# Patient Record
Sex: Female | Born: 1935 | Race: White | Hispanic: No | Marital: Married
Health system: Southern US, Community
[De-identification: ages and names within clinical notes are randomized; demographics above are authoritative.]

---

## 2004-09-14 ENCOUNTER — Ambulatory Visit: Payer: Self-pay | Admitting: Pain Medicine

## 2004-09-25 ENCOUNTER — Ambulatory Visit: Payer: Self-pay | Admitting: Pain Medicine

## 2004-11-01 ENCOUNTER — Ambulatory Visit: Payer: Self-pay | Admitting: Pain Medicine

## 2005-01-09 ENCOUNTER — Ambulatory Visit: Payer: Self-pay | Admitting: Pain Medicine

## 2005-01-15 ENCOUNTER — Ambulatory Visit: Payer: Self-pay | Admitting: Pain Medicine

## 2005-02-22 ENCOUNTER — Ambulatory Visit: Payer: Self-pay | Admitting: Pain Medicine

## 2005-02-28 ENCOUNTER — Ambulatory Visit: Payer: Self-pay | Admitting: Pain Medicine

## 2005-04-24 ENCOUNTER — Ambulatory Visit: Payer: Self-pay

## 2006-12-12 ENCOUNTER — Ambulatory Visit: Payer: Self-pay | Admitting: Specialist

## 2007-01-03 ENCOUNTER — Ambulatory Visit: Payer: Self-pay | Admitting: Specialist

## 2007-01-22 ENCOUNTER — Ambulatory Visit: Payer: Self-pay | Admitting: Specialist

## 2007-02-03 ENCOUNTER — Ambulatory Visit: Payer: Self-pay | Admitting: Oncology

## 2007-02-04 ENCOUNTER — Ambulatory Visit: Payer: Self-pay | Admitting: Oncology

## 2007-02-05 ENCOUNTER — Ambulatory Visit: Payer: Self-pay | Admitting: Oncology

## 2007-03-06 ENCOUNTER — Ambulatory Visit: Payer: Self-pay | Admitting: Oncology

## 2007-04-06 ENCOUNTER — Ambulatory Visit: Payer: Self-pay | Admitting: Oncology

## 2007-05-06 ENCOUNTER — Ambulatory Visit: Payer: Self-pay | Admitting: Oncology

## 2007-06-06 ENCOUNTER — Ambulatory Visit: Payer: Self-pay | Admitting: Oncology

## 2007-06-10 ENCOUNTER — Ambulatory Visit: Payer: Self-pay | Admitting: Oncology

## 2007-06-30 ENCOUNTER — Ambulatory Visit: Payer: Self-pay | Admitting: Oncology

## 2007-07-07 ENCOUNTER — Ambulatory Visit: Payer: Self-pay | Admitting: Oncology

## 2007-08-06 ENCOUNTER — Ambulatory Visit: Payer: Self-pay | Admitting: Radiation Oncology

## 2007-09-04 ENCOUNTER — Ambulatory Visit: Payer: Self-pay | Admitting: Oncology

## 2007-09-06 ENCOUNTER — Ambulatory Visit: Payer: Self-pay | Admitting: Radiation Oncology

## 2007-10-06 ENCOUNTER — Ambulatory Visit: Payer: Self-pay | Admitting: Oncology

## 2007-10-23 ENCOUNTER — Ambulatory Visit: Payer: Self-pay | Admitting: Oncology

## 2007-11-06 ENCOUNTER — Ambulatory Visit: Payer: Self-pay | Admitting: Oncology

## 2008-01-04 ENCOUNTER — Ambulatory Visit: Payer: Self-pay | Admitting: Oncology

## 2008-01-21 ENCOUNTER — Ambulatory Visit: Payer: Self-pay | Admitting: Oncology

## 2008-02-04 ENCOUNTER — Ambulatory Visit: Payer: Self-pay | Admitting: Internal Medicine

## 2008-02-12 ENCOUNTER — Other Ambulatory Visit: Payer: Self-pay

## 2008-02-12 ENCOUNTER — Ambulatory Visit: Payer: Self-pay | Admitting: Radiation Oncology

## 2008-02-13 ENCOUNTER — Inpatient Hospital Stay: Payer: Self-pay | Admitting: Internal Medicine

## 2008-03-05 ENCOUNTER — Ambulatory Visit: Payer: Self-pay | Admitting: Internal Medicine

## 2008-06-25 ENCOUNTER — Inpatient Hospital Stay: Payer: Self-pay | Admitting: Internal Medicine

## 2008-06-25 ENCOUNTER — Other Ambulatory Visit: Payer: Self-pay

## 2008-07-09 ENCOUNTER — Ambulatory Visit: Payer: Self-pay

## 2008-07-19 ENCOUNTER — Ambulatory Visit: Payer: Self-pay | Admitting: Specialist

## 2008-07-22 ENCOUNTER — Ambulatory Visit: Payer: Self-pay | Admitting: Specialist

## 2009-02-04 ENCOUNTER — Emergency Department: Payer: Self-pay | Admitting: Emergency Medicine

## 2009-06-29 ENCOUNTER — Inpatient Hospital Stay: Payer: Self-pay | Admitting: Internal Medicine

## 2010-04-05 ENCOUNTER — Ambulatory Visit: Payer: Self-pay | Admitting: Oncology

## 2010-04-30 ENCOUNTER — Inpatient Hospital Stay: Payer: Self-pay | Admitting: Internal Medicine

## 2010-05-05 ENCOUNTER — Ambulatory Visit: Payer: Self-pay | Admitting: Oncology

## 2010-06-05 ENCOUNTER — Ambulatory Visit: Payer: Self-pay | Admitting: Oncology

## 2010-11-30 ENCOUNTER — Ambulatory Visit: Payer: Self-pay | Admitting: Specialist

## 2011-02-04 ENCOUNTER — Emergency Department: Payer: Self-pay | Admitting: Emergency Medicine

## 2012-06-19 ENCOUNTER — Emergency Department: Payer: Self-pay | Admitting: *Deleted

## 2012-11-18 ENCOUNTER — Inpatient Hospital Stay: Payer: Self-pay | Admitting: Specialist

## 2012-11-18 LAB — COMPREHENSIVE METABOLIC PANEL
Albumin: 3.3 g/dL — ABNORMAL LOW (ref 3.4–5.0)
Alkaline Phosphatase: 91 U/L (ref 50–136)
Chloride: 102 mmol/L (ref 98–107)
Co2: 29 mmol/L (ref 21–32)
Creatinine: 1.16 mg/dL (ref 0.60–1.30)
EGFR (African American): 53 — ABNORMAL LOW
EGFR (Non-African Amer.): 45 — ABNORMAL LOW
Osmolality: 284 (ref 275–301)
Sodium: 137 mmol/L (ref 136–145)
Total Protein: 7.2 g/dL (ref 6.4–8.2)

## 2012-11-18 LAB — URINALYSIS, COMPLETE
Bilirubin,UR: NEGATIVE
Hyaline Cast: 4
Ph: 6 (ref 4.5–8.0)
Protein: NEGATIVE
Squamous Epithelial: 2

## 2012-11-18 LAB — CBC WITH DIFFERENTIAL/PLATELET
Eosinophil %: 0.9 %
HCT: 46.1 % (ref 35.0–47.0)
MCHC: 33.7 g/dL (ref 32.0–36.0)
MCV: 90 fL (ref 80–100)
Monocyte #: 0.8 x10 3/mm (ref 0.2–0.9)
Monocyte %: 9.1 %
Neutrophil %: 71.9 %
Platelet: 218 10*3/uL (ref 150–440)
RBC: 5.12 10*6/uL (ref 3.80–5.20)
WBC: 8.7 10*3/uL (ref 3.6–11.0)

## 2012-11-18 LAB — TROPONIN I: Troponin-I: <0.02 ng/mL

## 2012-11-19 LAB — BASIC METABOLIC PANEL
Anion Gap: 4 — ABNORMAL LOW (ref 7–16)
Calcium, Total: 10.6 mg/dL — ABNORMAL HIGH (ref 8.5–10.1)
Chloride: 106 mmol/L (ref 98–107)
Co2: 29 mmol/L (ref 21–32)
Creatinine: 0.96 mg/dL (ref 0.60–1.30)
EGFR (Non-African Amer.): 57 — ABNORMAL LOW
Osmolality: 278 (ref 275–301)
Sodium: 139 mmol/L (ref 136–145)

## 2012-11-22 LAB — PLATELET COUNT: Platelet: 179 10*3/uL (ref 150–440)

## 2012-11-24 LAB — CULTURE, BLOOD (SINGLE)

## 2012-11-24 LAB — PROTIME-INR: INR: 0.9

## 2012-11-25 LAB — CULTURE, BLOOD (SINGLE)

## 2013-02-03 DEATH — deceased

## 2013-05-04 IMAGING — CT CT CHEST W/ CM
1 series · 15 of 33 positions shown, 19 images · IV contrast (agent unspecified)
Comparison: 11/30/2010

REASON FOR EXAM: enlarging lung nodule
COMMENTS:

PROCEDURE:     CT  - CT CHEST WITH CONTRAST  - November 19, 2012  [DATE]
RESULT:     Indication: Enlarging lung nodule
TECHNIQUE: Multiple axial images of the chest are obtained with 75 mL of
Msovue-PGGintravenous contrast.

[Series 2: soft tissue · axial · 0.70mm/px · z∈[-546,-270]mm · 15 of 110 slices shown, 19 images]
[im 9/110  mediastinal]
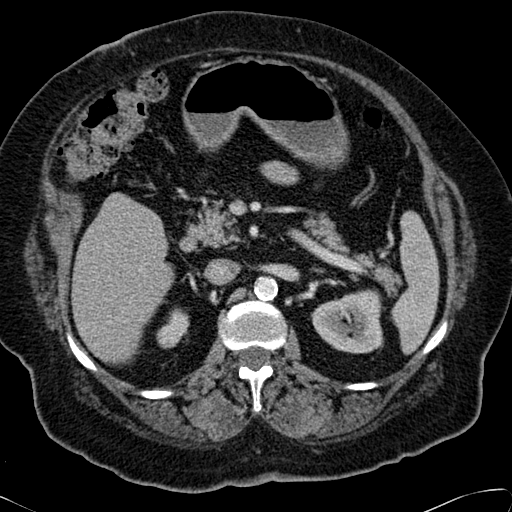
[im 9/110  lung]
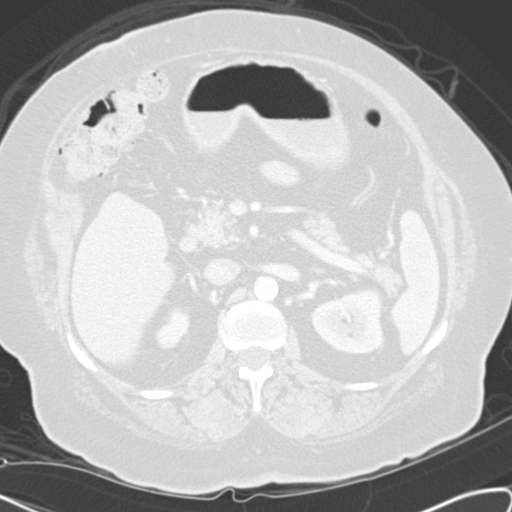
[im 17/110  lung]
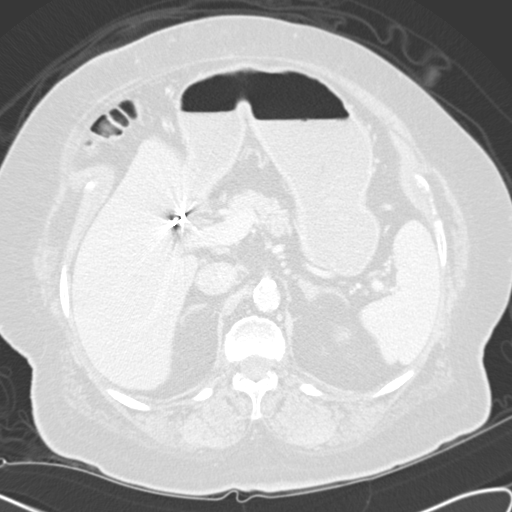
[im 22/110  lung]
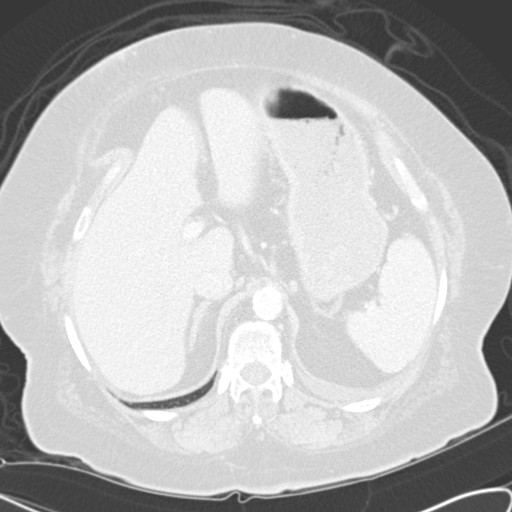
[im 29/110  lung]
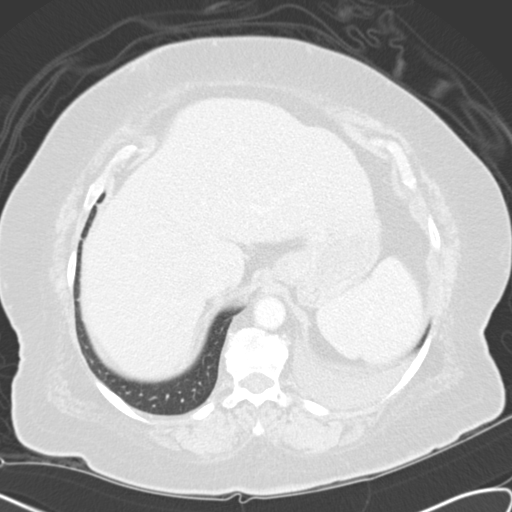
[im 37/110  mediastinal]
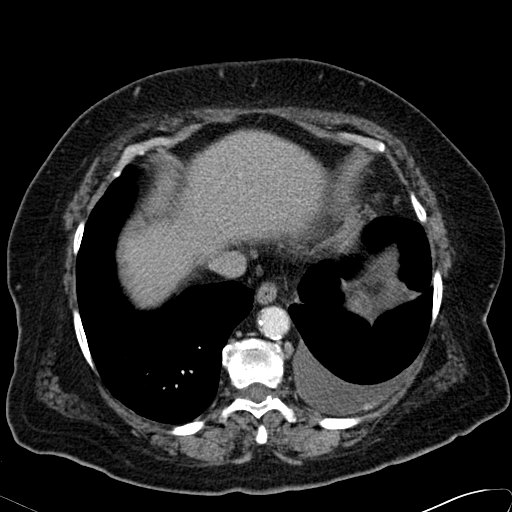
[im 37/110  lung]
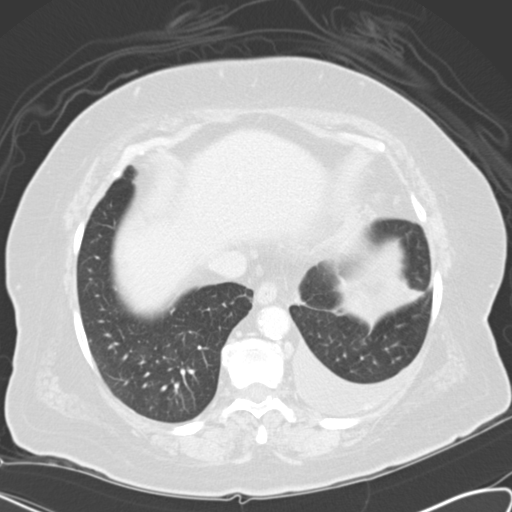
[im 44/110  lung]
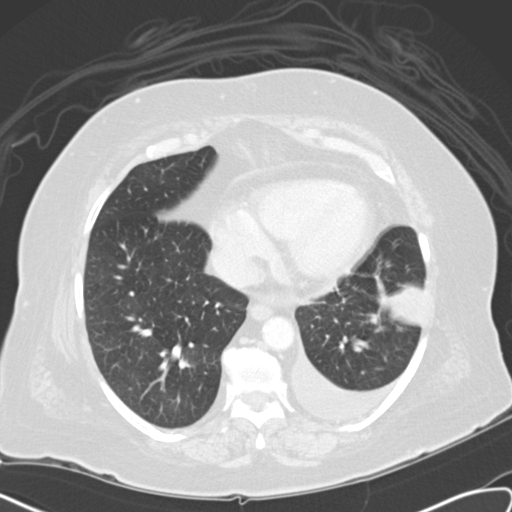
[im 49/110  lung]
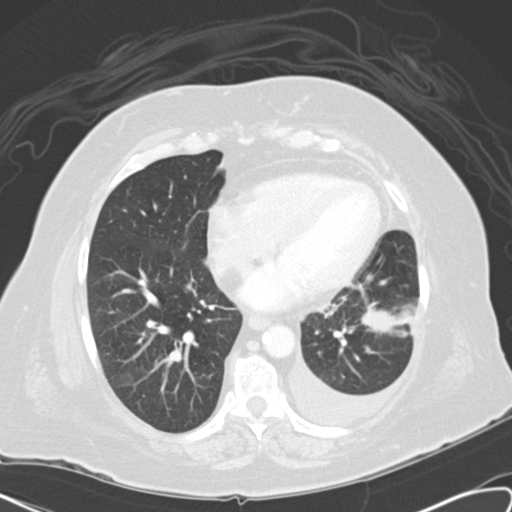
[im 57/110  lung]
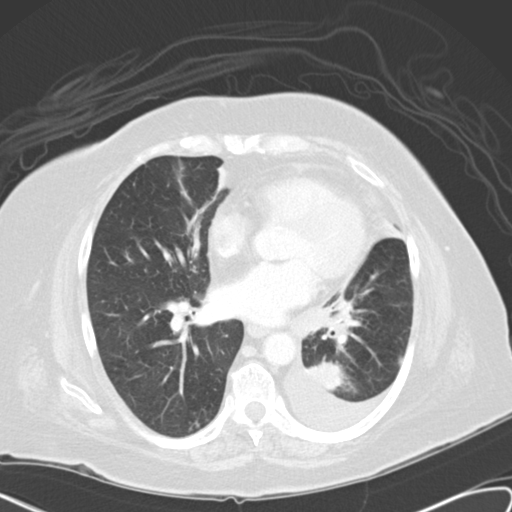
[im 61/110  mediastinal]
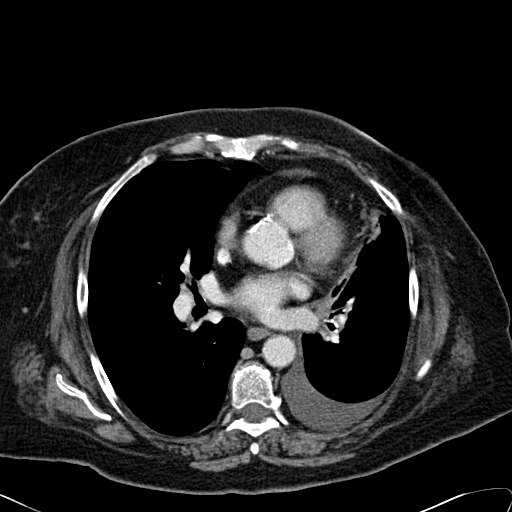
[im 61/110  lung]
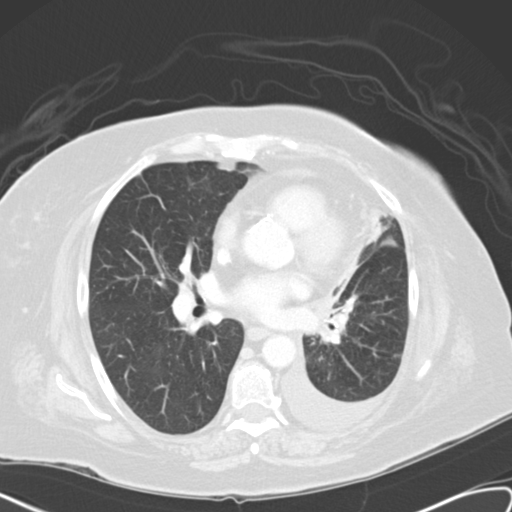
[im 66/110  lung]
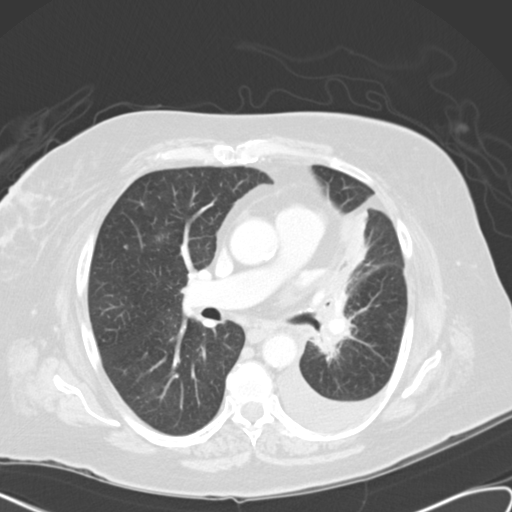
[im 73/110  lung]
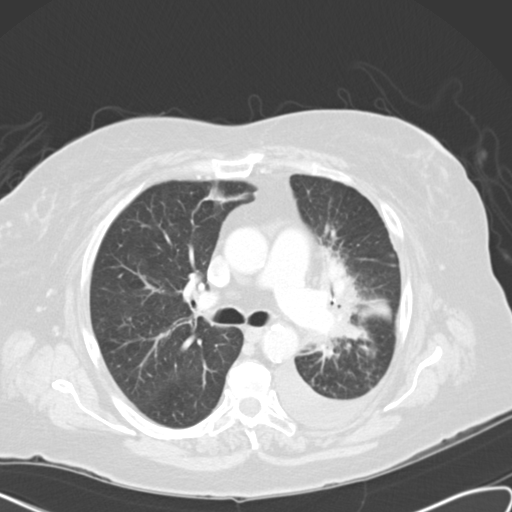
[im 81/110  lung]
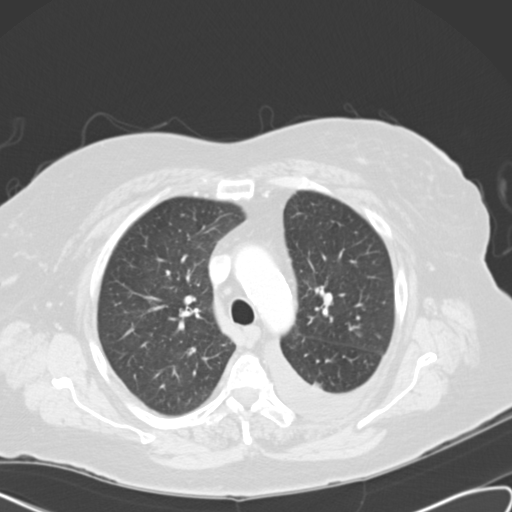
[im 88/110  mediastinal]
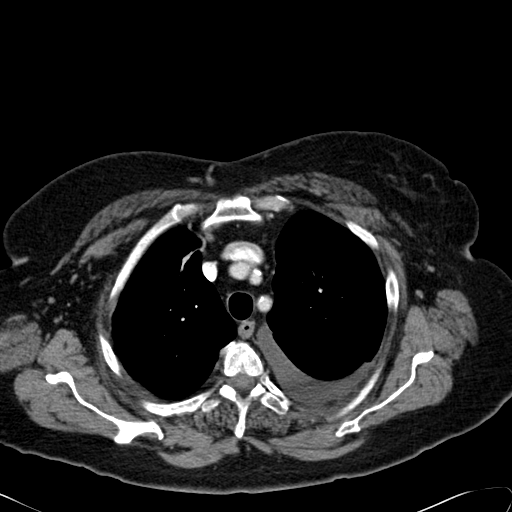
[im 88/110  lung]
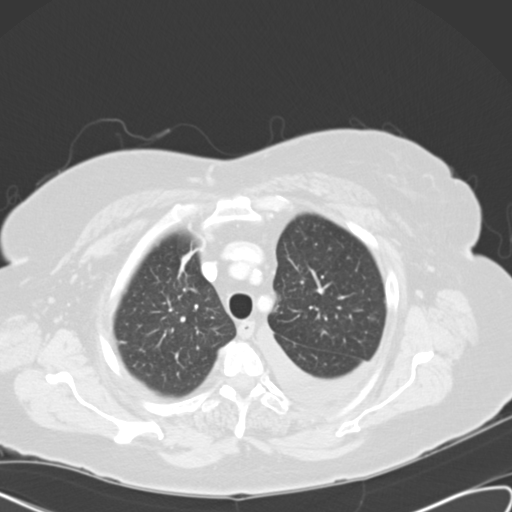
[im 93/110  lung]
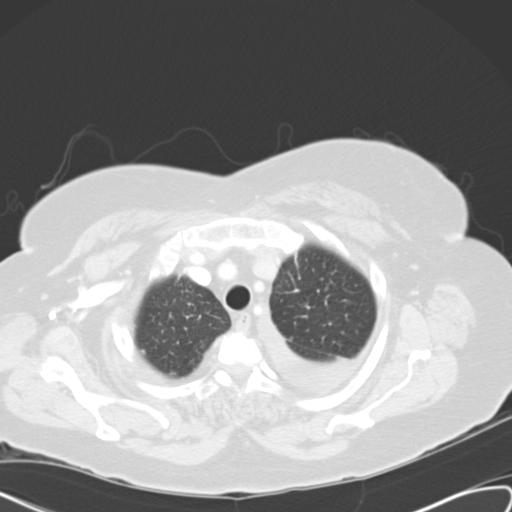
[im 101/110  lung]
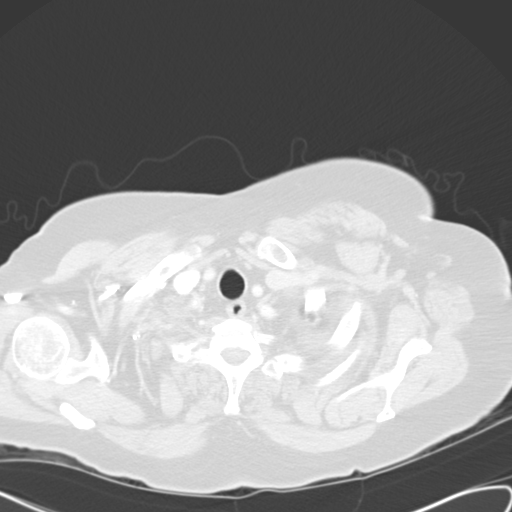

[15 of 33 positions shown; findings below may reference images not displayed]

FINDINGS: The central airways are patent. There is interval enlargement of the left
lower lobe pulmonary nodule. There is interval development of a left pleural
effusion. There is interval large bent of the second left lower lobe
pulmonary nodule measuring 4.5 x 2.7 cm on the current exam compared with
2.2 x 1.3 cm on the prior exam of 11/30/2010. There is a left perihilar soft
tissue attenuation which may represent posttreatment changes, but residual
malignancy cannot be excluded. There is no pleural effusion or pneumothorax.

There are no pathologically enlarged axillary, hilar, or mediastinal lymph
nodes.

The heart size is normal. There is no pericardial effusion. The thoracic
aorta is normal in caliber.  There is coronary artery atherosclerosis in the
left main pulmonary artery.

Review of bone windows demonstrates no focal lytic or sclerotic lesions.

Limited noncontrast images of the upper abdomen were obtained. The adrenal
glands appear normal. The remainder of the visualized abdominal organs are
unremarkable.
IMPRESSION: 1. Interval progression of the left lower lobe pulmonary nodules consistent
with worsening malignancy.

[REDACTED]

## 2013-05-09 IMAGING — US US GUIDE NEEDLE - US [PERSON_NAME]
1 series · 4 of 4 positions shown · non-contrast
Comparison: none

REASON FOR EXAM: diagnostic for possible lung cancer.  please send fluid
for cytology.
COMMENTS:

[Series 1: us guide needle - us (person_name) · 0.27mm/px · 4 of 4 slices shown]
[im 1/4]
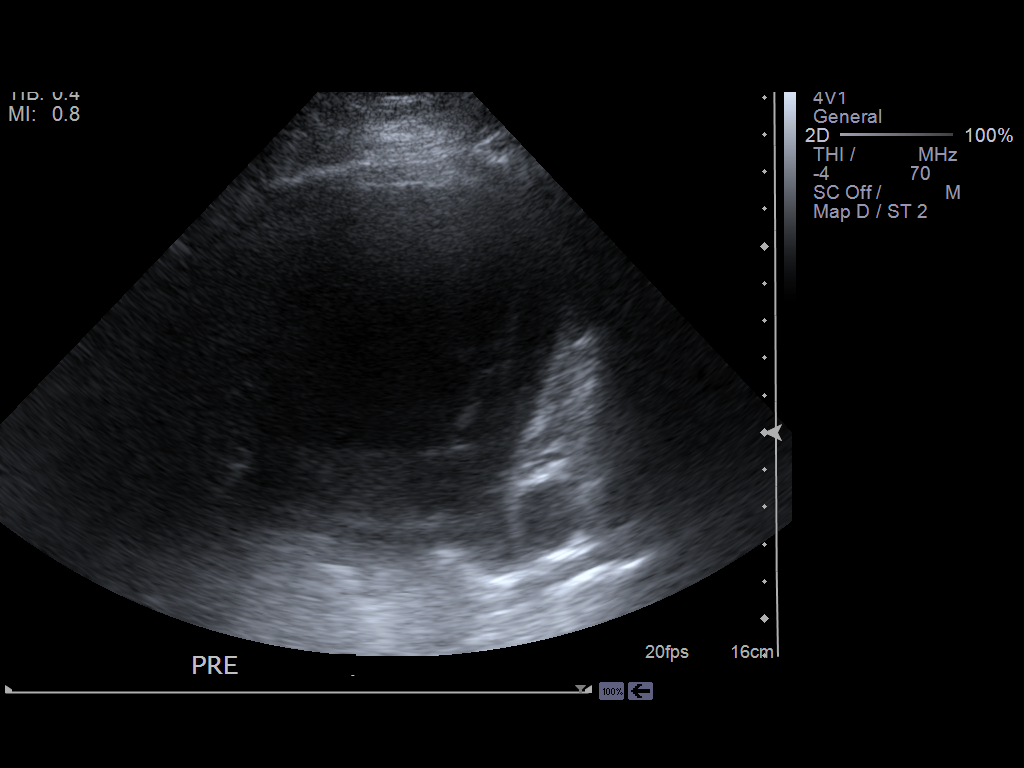
[im 2/4]
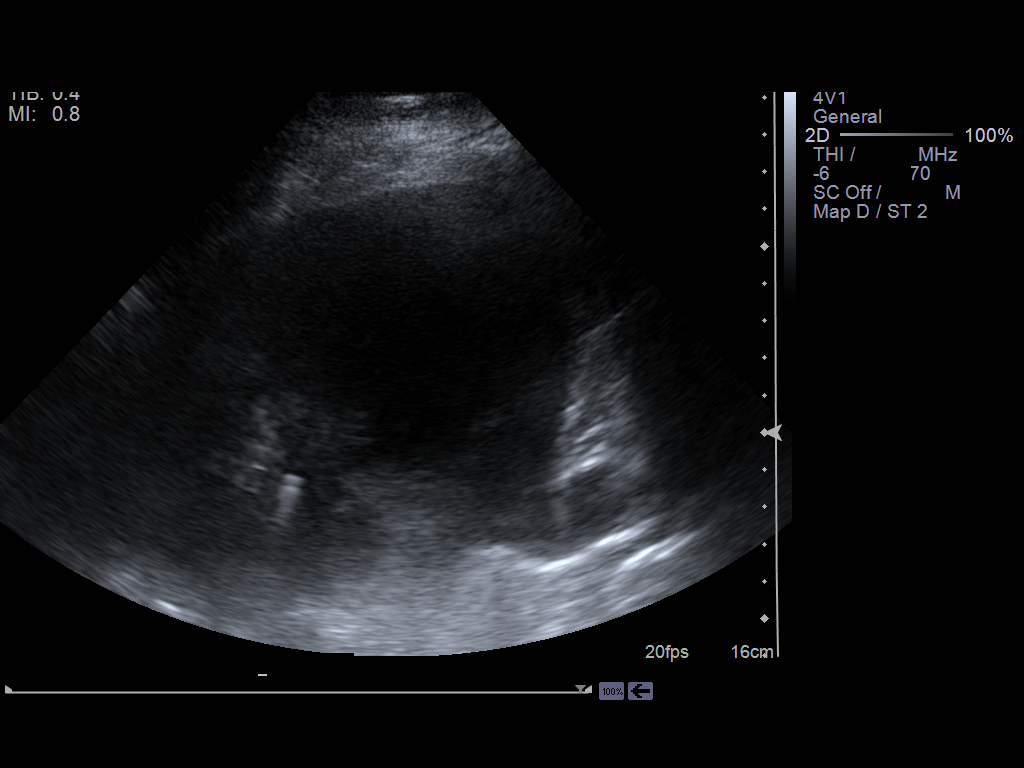
[im 3/4]
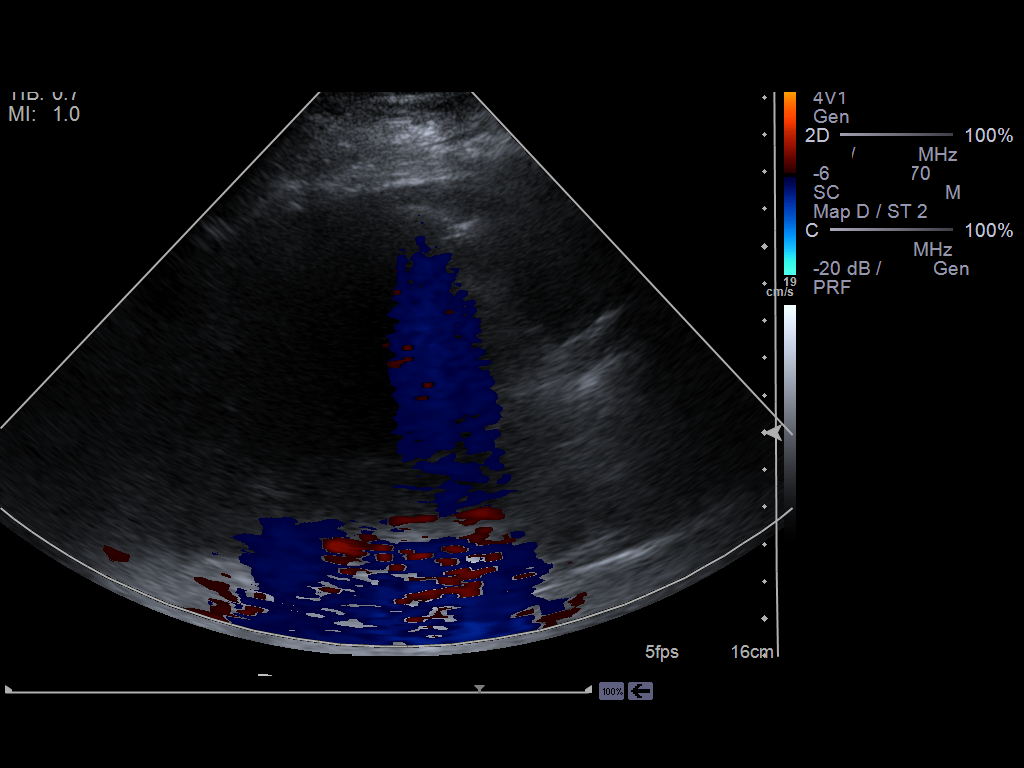
[im 4/4]
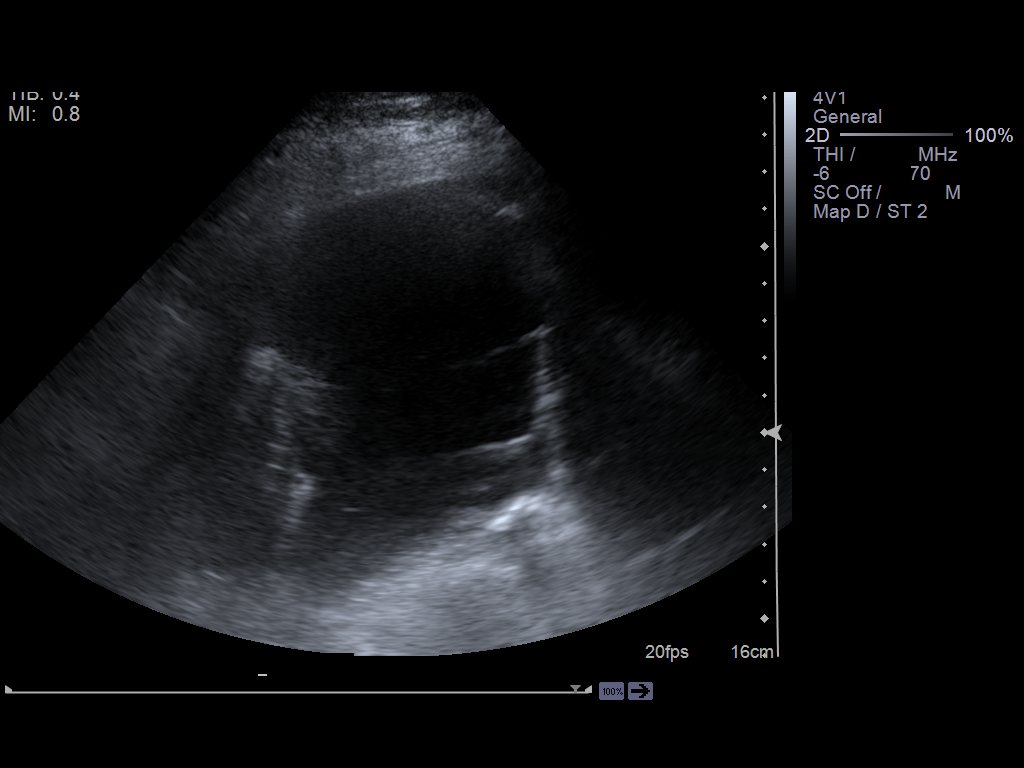

[4 of 4 positions shown; findings below may reference images not displayed]

PROCEDURE:     US  - US GUIDED THORACENTESIS LEFT  - November 24, 2012  [DATE]

RESULT:     After discussing the risk and benefits of this procedure the
patient informed consent  obtained. Back was sterilely prepped and draped
and following local anesthesia 1% lidocaine, 60 cc of yellow fluid removed
for cytology. No complications.
IMPRESSION: Successful thoracentesis.

## 2015-02-25 NOTE — Consult Note (Signed)
Patient seen and evaluated earlier today.  CT results noted with enlarging pulmonary nodule concerning for malignancy.  Have ordered a PET scan for further staging.  Patient will also require a CT-guided biopsy in the near future.  Full consult note to follow up tomorrow.  Appreciate consult, will follow.  Electronic Signatures: Gerarda FractionFinnegan, Timothy (MD)  (Signed on 15-Jan-14 16:44)  Authored  Last Updated: 15-Jan-14 16:44 by Gerarda FractionFinnegan, Timothy (MD)

## 2015-02-25 NOTE — Consult Note (Signed)
Patient refusing CT-guided biopsy.  Recommend close followup with her PCP with hopes that she changes her mind regarding a biopsy. No oncology followup is necessary at this time.  Appreciate consult, call with questions.   Electronic Signatures: Gerarda FractionFinnegan, Timothy (MD)  (Signed on 17-Jan-14 12:58)  Authored  Last Updated: 17-Jan-14 12:58 by Gerarda FractionFinnegan, Timothy (MD)

## 2015-02-25 NOTE — Consult Note (Signed)
Psychiatry note noted.  Given this, patient is also likely incapable of consenting for chemotherapy.  Cytology from thoracentesis is negative for malignancy.  She still will require a CT-guided biopsy if she wishes to pursue treatment.  Will follow.  Electronic Signatures: Gerarda FractionFinnegan, Timothy (MD)  (Signed on 22-Jan-14 16:25)  Authored  Last Updated: 22-Jan-14 16:25 by Gerarda FractionFinnegan, Timothy (MD)

## 2015-02-25 NOTE — Consult Note (Signed)
History of Present Illness:   Reason for Consult PET-positive lung mass, concern for underlying malignancy.    HPI   Patient has not been seen in the [facility] for greater than 2 half years at which time she was being worked up for a suspicious lung lesion.  CT scan revealed persistence of the lesion that has increased in size.  It is also PET positive.  It is highly suspicious for underlying malignancy.  Patient continues to have facial pain from her fall and seems slightly confused, but otherwise feels well.  She denies any neurologic complaints.  She has no chest pain, shortness of breath, or cough.  She denies any weight loss.  She has no nausea, vomiting, constipation, or diarrhea.  Patient offers no further specific complaints today.  PFSH:   Additional Past Medical and Surgical History Past medical history: COPD, OSA, hypertension, hyperlipidemia, diabetes, osteoporosis, hyperparathyroidism.  Past surgical history: Cholecystectomy, bladder surgery, partial hysterectomy.  Social history: Heavy tobacco use, none currently.  No report of alcohol use.  Family history: Both mother and brother died of "cancer".   Review of Systems:   Performance Status (ECOG) 2    Review of Systems   As per HPI. Otherwise, 10 point system review was negative.   NURSING NOTES: **Vital Signs.:   16-Jan-14 14:00    Vital Signs Type: Routine    Temperature Temperature (F): 97.3    Celsius: 36.2    Temperature Source: oral    Pulse Pulse: 77    Respirations Respirations: 18    Systolic BP Systolic BP: 712    Diastolic BP (mmHg) Diastolic BP (mmHg): 70    Mean BP: 95    Pulse Ox % Pulse Ox %: 98    Pulse Ox Activity Level: At rest    Oxygen Delivery: 4L; Nasal Cannula   Physical Exam:   Physical Exam General: Well-developed, well-nourished, no acute distress. Eyes: Pink conjunctiva, anicteric sclera. HEENT: Large ecchymosis on right face secondary to fall Lungs: Clear to  auscultation bilaterally. Heart: Regular rate and rhythm. No rubs, murmurs, or gallops. Abdomen: Soft, nontender, nondistended. No organomegaly noted, normoactive bowel sounds. Musculoskeletal: No edema, cyanosis, or clubbing. Neuro: Alert, mildly confused. Cranial nerves grossly intact. Skin: No rashes or petechiae noted. Psych: Normal affect. Lymphatics: No cervical, calvicular, axillary or inguinal LAD.    Macrobid: Rash  Erythromycin: Hives  Penicillin: Unknown  Tape: Unknown    Novolin 70/30 subcutaneous suspension: Inject 68 units [place] each morning and   Inject 63 units [place] each night at supper, Active, 1, None   furosemide 20 mg oral tablet: 1  orally once a day , Active, 0, None   fluticasone nasal 0.05 mg/inh spray: 1  nasal once a day , Active, 0, None   Rhinocort Aqua 0.032 mg/inh nasal spray: 1  nasal once a day , Active, 0, None   Novolin 70/30 subcutaneous suspension: 68 units in am subcutaneous and 63 units in pm  , Active, 0, None   melatonin 3 mg oral tablet: 1  orally once a day (at bedtime) , Active, 0, None   Advair Diskus 250 mcg-50 mcg inhalation powder: 1  inhaled 2 times a day , Active, 0, None  Laboratory Results: Routine Chem:  15-Jan-14 12:36    Glucose, Serum 79   BUN 16   Creatinine (comp) 0.96   Sodium, Serum 139   Potassium, Serum 4.3   Chloride, Serum 106   CO2, Serum 29   Calcium (Total),  Serum  10.6   Anion Gap  4   Osmolality (calc) 278   eGFR (African American) >60   eGFR (Non-African American)  57 (eGFR values <34m/min/1.73 m2 may be an indication of chronic kidney disease (CKD). Calculated eGFR is useful in patients with stable renal function. The eGFR calculation will not be reliable in acutely ill patients when serum creatinine is changing rapidly. It is not useful in  patients on dialysis. The eGFR calculation may not be applicable to patients at the low and high extremes of body sizes, pregnant women, and vegetarians.)    Radiology Results: Nuclear Med:    16-Jan-14 14:00, PET/CT Scan Lung Cancer Initial Staging   PET/CT Scan Lung Cancer Initial Staging    REASON FOR EXAM:    enlarging lung mass  COMMENTS:   LMP: N/A    PROCEDURE: PET - PET/CT INIT STAGING LUNG CA  - Nov 20 2012  2:00PM     RESULT: The patient is undergoing staging of lung malignancy. The   patient's fasting blood glucose level was 91 mg/dL. The patient was   unable to avoid coughing during the uptake phase. The patient received   13.9 mCi of F-18 labeled FDG at 12:08 p.m. with scanning beginning at   13:12 p.m. A noncontrast CT scan was performed at the same sitting for   coregistration and attenuation correction. Comparison is made to a study   dated May 02, 2010.    Uptake of the radiopharmaceutical within the neck is within the limits of   normal. A small amount of laryngeal activity is noted. Within the thorax     there is abnormal uptake in the posterior aspect of the left hilum and in   the left lower lobe. Left hilar activity exhibits maximal SUV of 6.5 with   a mean of 3.8. This corresponds to lymph nodes this region. Activity in   the left lower lobe posteromedially exhibits SUV of 5.1 max with a mean   of 3. This corresponds to a soft tissue mass measuring approximately 2 cm   in diameter. There is an associated left pleural effusion. There is   uptake in the subcarinal region corresponding with maximal SUV of 5.2   with a mean 2.6. This appears to lie within a normal sized lymph node.   Pleural-based increased uptake postero- laterally in the left lower lobe   is demonstrated. It exhibits SUV of 7 with a mean of 3.5    Within the abdomen and pelvis normally expected urinary tract uptake is   demonstrated. I do not see abnormal uptake within the liver nor adrenal   glands. No abnormal skeletal uptake is demonstrated. There is small   amount of activity and the perineal region which does not correspond to     soft  tissue abnormality which likely reflects urinary contamination.   There is also a tiny focus along the posterior aspect of the left gluteal   skin surface which likely reflects urinary contamination as well.    On the CT images there is a moderate-sized pleural effusion on the left   layering posteriorly. Pleural-based masses are noted medially in the left   paravertebral region and laterally in the posterior lateral aspect of the   left lower lobe. I do not see abnormal masses on the right. No bulky   mediastinal lymphadenopathy is demonstrated. There is soft tissue   fullness in the left hilar region. Within the abdomen the gallbladder is  surgically absent. There is no evidence of ascites. No lymphadenopathy is   demonstrated.    IMPRESSION:    1. There is abnormal uptake in the left lower lobe and in the left hilum     and subcarinal regions worrisome for malignancy. The areas of abnormal   activity demonstrated today are new or more conspicuous than those   demonstratedon the previous study.  2.There is a small left pleural effusion which is slightly greater in   volume since the PET/CT study of 02 May 2010.     Dictation Site: 1        Verified By: DAVID A. Martinique, M.D., MD   Assessment and Plan:  Impression:   Enlarging PET-positive lung mass suspicious for underlying malignancy.  Plan:   1.  Possible lung cancer: Despite reports, there is no evidence that patient ever had any treatment for her presumed lung cancer.  There is no documentation of a biopsy or a confirmed diagnosis either.  PET scan and CT scan results noted.  Plan to get a CT-guided biopsy in the next 1-2 days to confirm the results.  Although patient seems to be a poor candidate for chemotherapy if necessary, she should return to the [facility] in approximately one week to discuss her biopsy results. consult, will follow.  Electronic Signatures: Delight Hoh (MD)  (Signed 16-Jan-14  19:04)  Authored: HISTORY OF PRESENT ILLNESS, PFSH, ROS, NURSING NOTES, PE, ALLERGIES, HOME MEDICATIONS, LABS, OTHER RESULTS, ASSESSMENT AND PLAN   Last Updated: 16-Jan-14 19:04 by Delight Hoh (MD)

## 2015-02-25 NOTE — Consult Note (Signed)
Details:    - Psychiatry: Followup visit with this patient yesterday to reassess her ability to make reasonable informed decisions. New information from social services suggest that the patient's home is dirty and cluttered and unsafe and that there really are not neighbors who are checking up on her frequently. Patient was interviewed and asked to describe her medical situation and needs and was not able to give very clear account. It was explained to her that the hospital team feels she is not ready to return to independent living because she is still not fully recovered and would benefit from going to rehabilitation first. Patient is declining this stating that she feels that she will be fine. She is not able to give a clear description of how she would manage falls at home or how she would be able to manage her medical needs. She is not able to acknowledge the potential risks. On further evaluation I feel at this time that the patient is not capable of making appropriate informed decisions about her own living situation.   Electronic Signatures: Audery Amellapacs, Dayne Dekay T (MD)  (Signed 22-Jan-14 09:07)  Authored: Details   Last Updated: 22-Jan-14 09:07 by Audery Amellapacs, Inigo Lantigua T (MD)

## 2015-02-25 NOTE — Discharge Summary (Signed)
PATIENT NAME:  Darlene SellerMAHALEY, Mecca A MR#:  [id number] DATE OF BIRTH:  1933-10-12  DATE OF ADMISSION:  11/18/2012 DATE OF DISCHARGE:  11/25/2012  ADMITTING DIAGNOSIS: Altered mental status, found on the floor soaked in urine, status post fall.   DISCHARGE DIAGNOSES: 1. Acute encephalopathy likely related to urinary tract infection and dehydration. The patient's mental status is back to baseline.  2. Possible urinary tract infection although urine culture showed multiple organisms, status post treatment with IV and subsequently p.o. antibiotics.  3. History of lung cancer. Had chemotherapy, likely has recurrent. Status post PET scan as well as lung biopsy and status post evaluation by oncology.  4. Pleural effusion status post thoracentesis with only 60 mL of fluid removed.  5. Hypertension.  6. Diabetes.  7. Falls and difficulty with ambulation with poor living situation. The patient has been spoken to multiple times regarding going to rehab but she refuses, was seen by psychiatry and is deemed competent.  8. Acute bronchospasm with wheezing. Could be due to just bronchospasm, but also underlying chronic obstructive pulmonary disease is possible. The patient is doing much better, is on chronic oxygen, even at home.  9. Possible mild dementia.  10. Obstructive sleep apnea, not on CPAP.  11. Hyperlipidemia.  12. Osteoporosis.  13. History of hyperparathyroidism.  14. Status post cholecystectomy.  15. Status post bladder tack in 1980. 16. Hysterectomy for cancer in 1960s.   PERTINENT LABS/RADIOLOGIC STUDIES: Admitting glucose was 228, BUN 20, creatinine 1.16, sodium 137, potassium 4.0, chloride 102, CO2 29 and calcium 10.5. LFTs showed albumin of 3.3. AST and ALT were normal. WBC count 8.7, hemoglobin 15.5 and platelet count was 218.   EKG: Normal sinus rhythm with nonspecific ST-T wave changes.   CT scan, maxillofacial, shows no facial fracture.   CT scan of the head showed no acute  intracranial processes.   Urine culture showed mixed bacterial organisms.  UA showed leukocytes 1+.  CT scan of the chest with contrast showed interval progression of the left lower lobe pulmonary nodule.  Blood cultures x 2: No growth.   PET scan showed abnormal uptake in the left lower lobe and the left hilum and subcarinal regions worrisome for malignancy. There are areas of abnormal activity demonstrated that are new. Small left pleural effusion noted.   CONSULTANTS:  1. Gerarda Fractionimothy Finnegan, MD. 2. Ned ClinesHerbon Fleming, MD.  HOSPITAL COURSE: Please refer to the H and P done by the admitting physician.   The patient is a 81 year old white female with previous history of lung cancer who lives at home alone who is being followed outpatient by social services. They went to visit her at home and when they arrived they noticed the patient's clothes were soaked in urine. She had fallen the day before when she tripped over a rug. She sustained an external hematoma, on the right side of her face. She was brought to the ED. In the ED, she was thought to be dehydrated and there was concern maybe she had a UTI. The patient was admitted for further treatment with hydration and treatment for UTI.  Her urine culture did subsequently show mixed flora. The patient also was noted to have abnormality on the chest x-ray. She does have a history of lung cancer in the past, which apparently was in remission. She underwent a CT of the chest which showed worsening mass, therefore, oncology was called. She underwent a biopsy of the mass and a PET scan. She likely has recurrence of her lung  cancer. She will be followed up as an outpatient for any further treatments. In terms of her generalized weakness, difficulty with ambulation and poor living situation we have been trying to convince the patient to go to rehab. We even had psychiatry come and evaluate the patient to evaluate her competency. They felt that she was competent to  make her own decisions and did not feel that she was committable.  Unfortunately, we have been unable to convince the patient of going to rehab. At this time, case managers from outside are discussing with the patient. If she agrees to go to a skilled nursing facility, she will be discharged to a skilled nursing facility; otherwise, she will be discharged home. They will see her and will arrange home health for her. At this time, due to the patient not wanting to go to rehab and is deemed competent, per psychiatry, she is being arranged for either go home or skilled nursing facility, based on final decision.   DISCHARGE MEDICATIONS: 1. Lasix 20 mg daily.  2. Rhinocort one nasal daily.  3. Melatonin 3 mg at bedtime.  4. Advair 250/50 INH 2 times a day. 5. Novolin 70/30, 68 units in the morning, 63 units at supper. 6. Tylenol 650 q. 6 hours p.r.n. for pain.  7. Combivent 2 puffs 4 times per day as needed.  HOME OXYGEN: 2 liters.   DIET: Low sodium.   ACTIVITY: As tolerated with a walker.   TIMEFRAME FOR FOLLOW-UP: In 1 to 2 weeks with primary MD. If no MD, needs to be referred to MD. Follow up with Dr. Orlie Dakin in 2 to 4 weeks and Dr. Meredeth Ide in 2 to 4 weeks.   TIME SPENT: 40 minutes. ____________________________ Lacie Scotts Allena Katz, MD shp:sb D: 11/25/2012 12:49:23 ET T: 11/25/2012 12:58:34 ET JOB#: [id number]  cc: Aralyn Nowak H. Allena Katz, MD, <Dictator> Charise Carwin MD ELECTRONICALLY SIGNED 11/26/2012 11:30
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
## 2015-02-25 NOTE — Consult Note (Signed)
PATIENT NAME:  Darlene SellerMAHALEY, Fabienne A MR#:  [id number] DATE OF BIRTH:  March 01, 1933  DATE OF CONSULTATION:  11/19/2012  REFERRING PHYSICIAN:   CONSULTING PHYSICIAN:  Herbon E. Meredeth IdeFleming, MD  CHIEF COMPLAINT:  Abnormal chest CT scan/chronic obstructive pulmonary disease.  Please evaluate.   HISTORY OF PRESENT ILLNESS:  The patient is a 81 year old lady whom we have not seen for over a year in the office that we have been following for COPD as well as a known left nodular lesion.  She states that she has not followed a doctor up in over 1-1/2 years.  She presents to us with a cough, shortness of breath.  She was found at home soaked in urine with a large hematoma of her right face.  Does not have any pre-fall or does not recall any amaurosis fugax.  As part of her work-up she had head CT scan that did not show any acute intracranial pressure.  Maxillofacial area CT showed no fractures.  Chest CT scan was done on January 15th based on abnormal chest x-ray and showed interval progression of the left lower lobe mass with no pathologically enlarged axillary, hilar, mediastinal nodes.  The lesion had gone from 2.2 x 1.1 cm on 11/30/2010 to 4.5 x 2.5 cm in size.  Dr. Orlie DakinFinnegan, oncology has been consulted.  PET scan to be ordered.  Anticipate moving on to transthoracic needle biopsy.  In regards to her COPD, she has minimum wheezing and chest tightness.  She is not bringing up any blood nor does she have any calf pain or bleeding elsewhere.  There were no family members in the room.   PAST MEDICAL HISTORY:   1.  COPD, baseline FEV1 is 0.68 liters.   2.  Ex-nicotine abuser.  3.  Obstructive sleep apnea, not using CPAP.  4.  Hypertension.  5.  Hyperlipidemia.  6.  Type 2 diabetes.  7.  Osteoporosis.  8.  Hyperparathyroidism.   PAST SURGICAL HISTORY:  Status post cholecystectomy in 1980, bladder tuck in 1980s, hysterectomy for uterine cancer   ALLERGIES:  ERYTHROMYCIN CAUSED A RASH, MACROBID CAUSING RASH,  PENICILLIN AND TAPE ALSO ALLERGIC TO.   SOCIAL HISTORY:  Widow, lives alone.  Ex-nicotine abuser.   FAMILY HISTORY:  Cancer, questionable type.   REVIEW OF SYSTEMS:  CONSTITUTIONAL:  No headache, dizziness,  choking spells nor is she having any fever, chills, no sweats.   HEENT:  No unusual headache, change in vision or hearing.  No sore throat, change in voice, neck pain, stiffness, parotid gland enlargement, mild facial tenderness or swelling as well as pain.  CHEST:  As per above.  No wheezing, chest tightness, hemoptysis, unstable angina, palpitations, syncope.   GASTROINTESTINAL:  No GERD, dysphagia, choking spells, nor is she having any nausea, vomiting, diarrhea, blood in stool, dysuria, flank pain, polyphagia, polydipsia, heat or cold intolerance.  SKIN:  Besides the facial ecchymotic change, no other rashes.  No nodes.   NEUROLOGIC:  No stroke, seizures, suicidal ideation.   MUSCULOSKELETAL:  No joint effusions, no tenderness.  The rest of the review today was noncontributory.   PHYSICAL EXAMINATION: VITAL SIGNS:  Temperature is 97.7, pulse 116, respiration 20, blood pressure 124/70, O2 saturation is 95% on 4 liters nasal cannula.  GENERAL:  Somewhat obese lady lying in bed with glasses on, ecchymotic changes of the right face extending from her temporal area to her chin.  HEENT:  Extraocular movements intact.  Depressible zygomatic findings.  No oral lesions or exudates.  NECK:  Supple.  No JVD, nodes, thyromegaly or stridor.  LUNGS:  Bilateral breath sounds, decreased at the bases however.  CARDIAC:  Regular rhythm.  No obvious murmur.  ABDOMEN:  Soft, nontender.  No palpable abnormalities.  There were anterior abdominal ecchymotic changes as well.  EXTREMITIES:  No significant edema.  No Homan's sign.  SKIN:  Fair turgor.  No rashes.  LYMPHATIC:  No nodes in the neck or subclavicular areas .   NEUROLOGIC:  Able to move all extremities against gravity.  Slight decreased hearing.   Otherwise, no other gross cranial deficits.  PSYCHIATRIC:  Alert, oriented to place and person.  Fair insight.  Did take multiple attempts to explain, but she finally seemed to understand the gravity of her problem at this point in regards to the CT findings.   LABORATORY DATA:  Glucose 79, BUN 16, creatinine 0.96, sodium 139, potassium 4.3, chloride 106, CO2 29.  White count 8.7, hemoglobin 15.5, hematocrit 46.1, platelets 218.  Blood culture, one bottle had coag-negative Staphylococcus, most likely contamination. Urine did show mixed bacterial organism, also suggestive of contamination.  EKG showed normal sinus rhythm with nonspecific ST changes.   IMPRESSION:   1.  Expanding left lower lung nodular density/mass concerning for malignancy.  2.  Ex-nicotine abuser.  3.  Severe chronic obstructive pulmonary disease.  Doubt that this patient would be a surgical candidate.  However, I do think it warrants getting a tissue diagnosis and deciding whether she would at least tolerate radiation for palliative reasons plus or minus chemotherapy.  We will defer to oncology.  4.  Obesity.  5.  Obstructive sleep apnea, noncompliant with her CPAP.   RECOMMENDATIONS:   1.  Today we are going to continue same COPD regimen 2.  Following oncology recommendations.  3.  PET scan tomorrow.  4.  ?  transthoracic needle biopsy.  5.  We will reassess in the morning.  Further orders per above.      ____________________________ Clenton Pare. Meredeth Ide, MD hef:ea D: 11/21/2012 17:01:33 ET T: 11/22/2012 01:59:27 ET JOB#: [id number]  cc: Herbon E. Meredeth Ide, MD, <Dictator> Mertie Moores MD ELECTRONICALLY SIGNED 12/12/2012 16:58

## 2015-02-25 NOTE — Consult Note (Signed)
Psychiatry: Patient seen for consultation regarding her ability to make decisions about discharge. Patient seen and chart reviewed. patient was brought into the hospital after being found at home having fallen and injured her face and evidently not being able to care for herself because she had wet herself. It appears that the treatment team is suggesting placement and some kind of assisted living facility and the patient has been adamant about disagreeing. was awake and alert when I came to see her. She was interactive in the interview. Eye contact normal. Psychomotor activity is slow but due to her illness. Speech slow but understandable. Affect reactive. Mood stated as being anxious to go home. No obvious delusions or loosening of associations. Denied hallucinations. Denied suicidal or homicidal ideation. Patient was alert and oriented to her current situation in the hospital and the date. She was able to remember 3 out of 3 objects at 2 minutes. I did not conduct a full Mini-Mental status exam. He was not able to immediately give me a clear description of why she was in the hospital but I think part of this may have been suspicion that my motives. With some urging she was able to explain that she had a fall in that someone called social services. does not appear to have any past psychiatric history. tells me that she wants to go home. She believes that she is safe to take care of herself at home and she particularly is anxious about being with her dog. She is able to tell me that she is aware of the risks of a fall at home. She is aware that there is some risk of serious morbidity or death from a fall. She is able to tell me that that is a risk that she thinks is acceptable because of her preference to live at home. She told me that she was able to stand up and walk around. When I asked her to do it she actually was able to stand and walk around her room on her own. was aware that they wanted to do a lung  biopsy tomorrow. She appears to be quite frightened about it. think this is a possibly borderline case but I would come down on saying that she is capable of making decisions about her living situation. I think she understands the seriousness of her condition but prefers to live independently. She is not severely demented and does seem to have some retained ability to take care of herself. I think she is probably hesitant to discuss things in part out of fear over her illness. I suggest that all procedures and recommendations be discussed with her to give her the best opportunity to make decisions. Will follow if necessary.  Electronic Signatures: Audery Amellapacs, John T (MD)  (Signed on 17-Jan-14 22:02)  Authored  Last Updated: 17-Jan-14 22:02 by Audery Amellapacs, John T (MD)

## 2015-02-25 NOTE — Discharge Summary (Signed)
PATIENT NAME:  Darlene SellerMAHALEY, Lura A MR#:  [id number] DATE OF BIRTH:  12-04-1932  DATE OF ADMISSION:  11/18/2012 DATE OF DISCHARGE:  11/28/2012  ADDENDUM   This covers hospital course from January 22nd to January 24th.  DISCHARGE DIAGNOSES:  1. Acute encephalopathy secondary to suspected urinary tract infection and dehydration, now improved and resolved.  2. Urinary tract infection, now treated. 3. History of lung cancer status post chemotherapy, now with recurrence.  4. Hypertension.  5. Diabetes.  6. Frequent falls and difficulty ambulating with poor home living situation.  7. History of chronic obstructive pulmonary disease. 8. Obstructive sleep apnea.  9. Dementia.  10. Hyperlipidemia.  11. Osteoporosis.   DISCHARGE MEDICATIONS: 1. Lasix 20 mg daily. 2. Rhinocort one nasal spray daily. 3. Melatonin 3 mg at bedtime. 4. Advair 250/50 one puff 2 times a day. 5. Novolin 70/30 68 units in the morning, 63 units at supper. 6. Tylenol 650 mg q. 6 hours as needed. 7. Combivent 2 puffs 4 times daily.  HOSPITAL COURSE: This is a 81 year old female with history of lung cancer, diabetes and COPD with history of tobacco abuse who presented to the hospital as she was found down covered in her urine and noted to have a urinary tract infection.  1. Altered mental status. This was likely secondary to the urinary tract infection and also dehydration and acute renal failure. Her mental status is now back down to baseline. Her CT of the head was negative. She has been treated adequately for her UTI.  2. Urinary tract infection. Has been treated and now resolved.  3. Hypertension. She is stable on lisinopril.  4. Diabetes. The patient is stable on her Novolin 70/30 and sliding scale insulin with no evidence of hypoglycemia.  5. History of lung cancer with enlarging lung nodule on CT chest x-ray and CT with positive PET scan finding. The patient is status post thoracentesis and cytology on the pleural  fluid is negative. She likely will need a CT-guided liver biopsy, if she agrees to treatment, although she cannot make that decision at this time.  6. Falls with difficulty ambulating. The patient apparently has a very poor living situation. Therefore, she is not safe to be discharged and is currently awaiting placement to short-term rehab pending insurance approval.   CODE STATUS: FULL CODE.  TIME SPENT ON DISCHARGE: 35 minutes.  ____________________________ Rolly PancakeVivek J. Cherlynn KaiserSainani, MD vjs:sb D: 11/28/2012 13:27:41 ET T: 11/28/2012 13:54:14 ET JOB#: [id number]  cc: Rolly PancakeVivek J. Cherlynn KaiserSainani, MD, <Dictator> Houston SirenVIVEK J Burkley Dech MD ELECTRONICALLY SIGNED 12/02/2012 13:56

## 2015-02-25 NOTE — Consult Note (Signed)
Details:    - Psychiatry: Came by to followup with this patient today. We spoke a bit about the proposed lung biopsy and also about her function at home. She was a little bit more engaged with me today. Today I was less impressed that she has a clear understanding of the purpose of the medical plan. She claims that no one has explained to her what the plan is or what the proposal is for her discharge. She just continues to request going home. She was not able to give me a very reasonable description of how she would manage at home if she had another fall. She is not reporting depression. I am still going to save the I think she has the capacity to make her decisions about her living situation but I hope that perhaps she can be prevailed upon to consider at least going to rehabilitation. Today she tried to argue with me that she would not be able to afford it. I will try to continue to followup while she is in the hospital.   Electronic Signatures: Audery Amellapacs, Krystle Oberman T (MD)  (Signed 18-Jan-14 22:20)  Authored: Details   Last Updated: 18-Jan-14 22:20 by Audery Amellapacs, Kalianna Verbeke T (MD)
